# Patient Record
Sex: Male | Born: 1980 | Race: Black or African American | Hispanic: No | Marital: Married | Smoking: Never smoker
Health system: Southern US, Community
[De-identification: ages and names within clinical notes are randomized; demographics above are authoritative.]

---

## 2007-06-26 ENCOUNTER — Ambulatory Visit: Payer: Self-pay | Admitting: Family Medicine

## 2007-07-03 ENCOUNTER — Encounter: Payer: Self-pay | Admitting: Family Medicine

## 2007-07-03 LAB — CONVERTED CEMR LAB
ALT: 13 units/L (ref 0–53)
AST: 12 units/L (ref 0–37)
Albumin: 4.8 g/dL (ref 3.5–5.2)
Alkaline Phosphatase: 62 units/L (ref 39–117)
Calcium: 9.4 mg/dL (ref 8.4–10.5)
Chloride: 106 meq/L (ref 96–112)
LDL Cholesterol: 108 mg/dL — ABNORMAL HIGH (ref 0–99)
Potassium: 4.2 meq/L (ref 3.5–5.3)
Sodium: 141 meq/L (ref 135–145)
Total Protein: 7.5 g/dL (ref 6.0–8.3)

## 2007-07-04 ENCOUNTER — Encounter: Payer: Self-pay | Admitting: Family Medicine

## 2007-07-04 ENCOUNTER — Telehealth: Payer: Self-pay | Admitting: Family Medicine

## 2007-07-10 ENCOUNTER — Encounter: Admission: RE | Admit: 2007-07-10 | Discharge: 2007-08-07 | Payer: Self-pay | Admitting: Family Medicine

## 2007-08-29 ENCOUNTER — Encounter: Payer: Self-pay | Admitting: Family Medicine

## 2008-06-02 ENCOUNTER — Ambulatory Visit: Payer: Self-pay | Admitting: Occupational Medicine

## 2010-06-14 ENCOUNTER — Ambulatory Visit
Admission: RE | Admit: 2010-06-14 | Discharge: 2010-06-14 | Disposition: A | Discharge status: Home / Self Care | Payer: 59 | Source: Ambulatory Visit | Attending: Family Medicine | Admitting: Family Medicine

## 2010-06-14 ENCOUNTER — Encounter: Payer: Self-pay | Admitting: Family Medicine

## 2010-06-14 ENCOUNTER — Ambulatory Visit (INDEPENDENT_AMBULATORY_CARE_PROVIDER_SITE_OTHER): Payer: 59 | Admitting: Family Medicine

## 2010-06-14 ENCOUNTER — Other Ambulatory Visit: Payer: Self-pay | Admitting: Family Medicine

## 2010-06-14 DIAGNOSIS — M79609 Pain in unspecified limb: Secondary | ICD-10-CM

## 2010-06-14 DIAGNOSIS — M79673 Pain in unspecified foot: Secondary | ICD-10-CM

## 2010-06-18 ENCOUNTER — Telehealth (INDEPENDENT_AMBULATORY_CARE_PROVIDER_SITE_OTHER): Payer: Self-pay | Admitting: *Deleted

## 2010-06-23 NOTE — Progress Notes (Signed)
  Phone Note Outgoing Call Call back at [facility] Phone [phone]   Call placed by: Emilio Math,  June 18, 2010 6:41 PM Call placed to: Patient Summary of Call: Left msg hope his foot is better, call with problems or concerns

## 2010-06-23 NOTE — Assessment & Plan Note (Signed)
Summary: RIGHT FOOT PAIN (rm 4)   Vital Signs:  Patient Profile:   30 Years Old Male CC:      right foot/toe pain x 2 weeks Height:     71.5 inches Weight:      171 pounds O2 Sat:      99 % O2 treatment:    Room Air Temp:     98.5 degrees F oral Pulse rate:   62 / minute Resp:     14 per minute BP sitting:   110 / 73  (left arm) Cuff size:   regular  Pt. in pain?   yes    Location:   right foot    Intensity:   5    Type:       sharp  Vitals Entered By: Lajean Saver RN (June 14, 2010 4:15 PM)                   Updated Prior Medication List: No Medications Current Allergies: No known allergies History of Present Illness Chief Complaint: right foot/toe pain x 2 weeks History of Present Illness:  Subjective:  Patient complains of 2.5 week history of pain in the right second toe when bearing weight.  He recalls no trauma.  No recent change in activities.  No recent change in shoe wear.  REVIEW OF SYSTEMS Constitutional Symptoms      Denies fever, chills, night sweats, weight loss, weight gain, and fatigue.  Eyes       Denies change in vision, eye pain, eye discharge, glasses, contact lenses, and eye surgery. Ear/Nose/Throat/Mouth       Denies hearing loss/aids, change in hearing, ear pain, ear discharge, dizziness, frequent runny nose, frequent nose bleeds, sinus problems, sore throat, hoarseness, and tooth pain or bleeding.  Respiratory       Denies dry cough, productive cough, wheezing, shortness of breath, asthma, bronchitis, and emphysema/COPD.  Cardiovascular       Denies murmurs, chest pain, and tires easily with exhertion.    Gastrointestinal       Denies stomach pain, nausea/vomiting, diarrhea, constipation, blood in bowel movements, and indigestion. Genitourniary       Denies painful urination, kidney stones, and loss of urinary control. Neurological       Denies paralysis, seizures, and fainting/blackouts. Musculoskeletal       Complains of muscle  pain and joint pain.      Denies joint stiffness, decreased range of motion, redness, swelling, muscle weakness, and gout.      Comments: right foot Skin       Denies bruising, unusual mles/lumps or sores, and hair/skin or nail changes.  Psych       Denies mood changes, temper/anger issues, anxiety/stress, speech problems, depression, and sleep problems. Other Comments: no injury   Past History:  Past Medical History: Reviewed history from 06/26/2007 and no changes required. None  Past Surgical History: Reviewed history from 06/26/2007 and no changes required. None  Family History: Reviewed history from 06/26/2007 and no changes required. Unle with prostate Ca GF with DM, HTN, stroke  Social History: Reviewed history from 06/26/2007 and no changes required. 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 Married to [place] with no children.  Never Smoked Alcohol use-no Drug use-no Regular exercise-no   Objective:  Appearance:  Patient appears healthy, stated age, and in no acute distress  Right foot:  No swelling, warmth, or erythema.  There is mild tenderness over the second distal metatarsal and MTP joint.  All toes  have full range of motion.  Distal neurovascular intact. X-ray right foot:  negative. Assessment New Problems: FOOT PAIN, RIGHT (ICD-729.5)  ? METATARSALGIA VS ? [facility]  Plan New Orders: T-DG Foot Complete*R* [73630] Est. Patient Level III [56213] Planning Comments:   Obtain good quality shoe arch supports.  Begin NSAID for about a week.  Begin foot stretching exercises ([facility] information and instruction patient handout given)  If not improving 2 weeks, follow-up with sports med clinic.   The patient and/or caregiver has been counseled thoroughly with regard to medications prescribed including dosage, schedule, interactions, rationale for use, and possible side effects and they verbalize understanding.  Diagnoses and expected course of recovery  discussed and will return if not improved as expected or if the condition worsens. Patient and/or caregiver verbalized understanding.   Orders Added: 1)  T-DG Foot Complete*R* [73630] 2)  Est. Patient Level III [08657]

## 2011-01-31 ENCOUNTER — Encounter: Payer: Self-pay | Admitting: *Deleted

## 2011-01-31 ENCOUNTER — Emergency Department (HOSPITAL_BASED_OUTPATIENT_CLINIC_OR_DEPARTMENT_OTHER)
Admission: EM | Admit: 2011-01-31 | Discharge: 2011-01-31 | Disposition: A | Discharge status: Home / Self Care | Payer: 59 | Attending: Emergency Medicine | Admitting: Emergency Medicine

## 2011-01-31 DIAGNOSIS — W1809XA Striking against other object with subsequent fall, initial encounter: Secondary | ICD-10-CM | POA: Insufficient documentation

## 2011-01-31 DIAGNOSIS — S0990XA Unspecified injury of head, initial encounter: Secondary | ICD-10-CM

## 2011-01-31 MED ORDER — IBUPROFEN 400 MG PO TABS
400.0000 mg | ORAL_TABLET | Freq: Once | ORAL | Status: AC
  Administered 2011-01-31: 400 mg via ORAL
  Filled 2011-01-31: qty 1

## 2011-01-31 NOTE — ED Notes (Signed)
Patient reports that he was closing the doors on a tractor trailer and the door on the building came down and hit him in the forehead.  Denies loc.  States he was knocked to the floor.  Swelling to forehead.

## 2011-01-31 NOTE — ED Notes (Signed)
EMS reports patient was at work and was hit in the forehead with heavy bays doors while working. States patient lowered himself to the floor.  No loc.  Swelling in the forehead.

## 2011-01-31 NOTE — ED Provider Notes (Signed)
History   30yM with head injury. Struck top of head on a door while unloading truck. Fell to ground. No LOC. Persistent HA and R lateral neck pain. No visual complaints. No numbness, tingling or loss of strength. No n/v. No back pain. Doesn't feel confused. Denies use of blood thinning medications.  CSN: [id number] Arrival date & time: No admission date for patient encounter.  Chief Complaint  Patient presents with  . Head Injury    (Consider location/radiation/quality/duration/timing/severity/associated sxs/prior treatment) Patient is a 30 y.o. male presenting with head injury. The history is provided by the patient.  Head Injury  Pertinent negatives include no numbness, no tinnitus and no weakness.    History reviewed. No pertinent past medical history.  No past surgical history on file.  No family history on file.  History  Substance Use Topics  . Smoking status: Not on file  . Smokeless tobacco: Not on file  . Alcohol Use: Not on file      Review of Systems  Constitutional: Negative.   HENT: Positive for neck pain. Negative for hearing loss and tinnitus.   Eyes: Negative for photophobia and visual disturbance.  Gastrointestinal: Negative.   Genitourinary: Negative.   Musculoskeletal: Negative for back pain.  Skin: Negative.   Neurological: Positive for headaches. Negative for dizziness, seizures, speech difficulty, weakness, light-headedness and numbness.  Psychiatric/Behavioral: Negative.   All other systems reviewed and are negative.    Allergies  Review of patient's allergies indicates no known allergies.  Home Medications  No current outpatient prescriptions on file.  BP 122/73  Pulse 57  Temp(Src) 97.9 F (36.6 C) (Oral)  Resp 18  SpO2 100%  Physical Exam  Constitutional: He is oriented to person, place, and time. He appears well-developed and well-nourished.  HENT:  Head: Normocephalic and atraumatic.  Right Ear: External ear normal.  Left  Ear: External ear normal.  Eyes: Conjunctivae and EOM are normal. Pupils are equal, round, and reactive to light.  Neck: Normal range of motion.       Mild r lateral neck tenderness. FROM. No crepitus or skin changes. No midline spinal tenderness.  Pulmonary/Chest: Effort normal and breath sounds normal. No respiratory distress.  Abdominal: There is no tenderness.  Musculoskeletal: Normal range of motion.  Neurological: He is alert and oriented to person, place, and time. No cranial nerve deficit. He exhibits normal muscle tone. Coordination normal.       Good finger-to-nose bilaterally.  Skin: Skin is warm and dry.  Psychiatric: He has a normal mood and affect.    ED Course  Procedures (including critical care time)  Labs Reviewed - No data to display No results found.   No diagnosis found.    MDM  30yM with HA after being struck in hea.. No LOC. Nonfocal neuro exam. No midline spinal tenderness. Discussed head injury instructions with pt. Do not feel neuro imaging indicated at this time.       Raeford Razor, MD 01/31/11 [phone]

## 2017-01-26 ENCOUNTER — Encounter: Payer: Self-pay | Admitting: Emergency Medicine

## 2017-01-26 ENCOUNTER — Emergency Department (INDEPENDENT_AMBULATORY_CARE_PROVIDER_SITE_OTHER)
Admission: EM | Admit: 2017-01-26 | Discharge: 2017-01-26 | Disposition: A | Discharge status: Home / Self Care | Payer: 59 | Source: Home / Self Care | Attending: Family Medicine | Admitting: Family Medicine

## 2017-01-26 DIAGNOSIS — J069 Acute upper respiratory infection, unspecified: Secondary | ICD-10-CM | POA: Diagnosis not present

## 2017-01-26 MED ORDER — BENZONATATE 100 MG PO CAPS: 100.0000 mg | ORAL_CAPSULE | Freq: Three times a day (TID) | ORAL | 0 refills | Status: AC

## 2017-01-26 MED ORDER — AZITHROMYCIN 250 MG PO TABS: 250.0000 mg | ORAL_TABLET | Freq: Every day | ORAL | 0 refills | Status: AC

## 2017-01-26 NOTE — ED Triage Notes (Signed)
Productive cough x 5 days

## 2017-01-26 NOTE — Discharge Instructions (Signed)
°  You may take 500mg acetaminophen every 4-6 hours or in combination with ibuprofen 400-600mg every 6-8 hours as needed for pain, inflammation, and fever. ° °Be sure to drink at least eight 8oz glasses of water to stay well hydrated and get at least 8 hours of sleep at night, preferably more while sick.  ° °Please take antibiotics as prescribed and be sure to complete entire course even if you start to feel better to ensure infection does not come back. ° °

## 2017-01-26 NOTE — ED Provider Notes (Signed)
[facility]    CSN: [id number] Arrival date & time: 01/26/17  1947     History   Chief Complaint Chief Complaint  Patient presents with  . Cough    HPI David Wilson is a 36 y.o. male.   HPI David Wilson is a 36 y.o. male presenting to UC with c/o worsening productive cough for the last 5 days, associated body aches and fatigue.  He was seen by his PCP earlier this week and receive a steroid shot and Flonase but states he is not improving. He has been taking Mucinex DM as well with minimal relief. Denies fever, chills, n/v/d. No known sick contacts.   History reviewed. No pertinent past medical history.  There are no active problems to display for this patient.   History reviewed. No pertinent surgical history.     Home Medications    Prior to Admission medications   Medication Sig Start Date End Date Taking? Authorizing Provider  fluticasone (FLONASE) 50 MCG/ACT nasal spray Place into both nostrils daily.   Yes [provider]  azithromycin (ZITHROMAX) 250 MG tablet Take 1 tablet (250 mg total) by mouth daily. Take first 2 tablets together, then 1 every day until finished. 01/26/17   Lurene Shadow, PA-C  benzonatate (TESSALON) 100 MG capsule Take 1-2 capsules (100-200 mg total) by mouth every 8 (eight) hours. 01/26/17   Lurene Shadow, PA-C    Family History No family history on file.  Social History Social History  Substance Use Topics  . Smoking status: Never Smoker  . Smokeless tobacco: Never Used  . Alcohol use No     Allergies   Patient has no known allergies.   Review of Systems Review of Systems  Constitutional: Positive for fatigue. Negative for chills and fever.  HENT: Positive for congestion. Negative for ear pain, sore throat, trouble swallowing and voice change.   Respiratory: Positive for cough and chest tightness. Negative for shortness of breath.   Cardiovascular: Negative for chest pain and palpitations.    Gastrointestinal: Negative for abdominal pain, diarrhea, nausea and vomiting.  Musculoskeletal: Positive for arthralgias and myalgias. Negative for back pain.  Skin: Negative for rash.     Physical Exam Triage Vital Signs ED Triage Vitals  Enc Vitals Group     BP 01/26/17 2002 112/78     Pulse Rate 01/26/17 2002 (!) 56     Resp --      Temp 01/26/17 2002 98.5 F (36.9 C)     Temp Source 01/26/17 2002 Oral     SpO2 01/26/17 2002 98 %     Weight 01/26/17 2002 207 lb (93.9 kg)     Height 01/26/17 2002  (1.803 m)     Head Circumference --      Peak Flow --      Pain Score 01/26/17 2003 0     Pain Loc --      Pain Edu? --      Excl. in GC? --    No data found.   Updated Vital Signs BP 112/78 (BP Location: Left Arm)   Pulse (!) 56   Temp 98.5 F (36.9 C) (Oral)   Ht  (1.803 m)   Wt 207 lb (93.9 kg)   SpO2 98%   BMI 28.87 kg/m   Visual Acuity Right Eye Distance:   Left Eye Distance:   Bilateral Distance:    Right Eye Near:   Left Eye Near:  Bilateral Near:     Physical Exam  Constitutional: He is oriented to person, place, and time. He appears well-developed and well-nourished. No distress.  HENT:  Head: Normocephalic and atraumatic.  Right Ear: Tympanic membrane normal.  Left Ear: Tympanic membrane normal.  Nose: Mucosal edema present. Right sinus exhibits no maxillary sinus tenderness and no frontal sinus tenderness. Left sinus exhibits no maxillary sinus tenderness and no frontal sinus tenderness.  Mouth/Throat: Uvula is midline, oropharynx is clear and moist and mucous membranes are normal.  Eyes: EOM are normal.  Neck: Normal range of motion. Neck supple.  Cardiovascular: Normal rate and regular rhythm.   Pulmonary/Chest: Effort normal. No stridor. No respiratory distress. He has no wheezes. He has rhonchi (faint, diffuse). He has no rales.  Musculoskeletal: Normal range of motion.  Lymphadenopathy:    He has no cervical adenopathy.   Neurological: He is alert and oriented to person, place, and time.  Skin: Skin is warm and dry. He is not diaphoretic.  Psychiatric: He has a normal mood and affect. His behavior is normal.  Nursing note and vitals reviewed.    UC Treatments / Results  Labs (all labs ordered are listed, but only abnormal results are displayed) Labs Reviewed - No data to display  EKG  EKG Interpretation None       Radiology No results found.  Procedures Procedures (including critical care time)  Medications Ordered in UC Medications - No data to display   Initial Impression / Assessment and Plan / UC Course  I have reviewed the triage vital signs and the nursing notes.  Pertinent labs & imaging results that were available during my care of the patient were reviewed by me and considered in my medical decision making (see chart for details).     Will cover for atypical bacteria with azithromycin.  Advised pt could may linger for several weeks but should gradually improve. Recommend taking plain mucinex instead of mucinex DM Encouraged fluids and rest F/u with PCP in 7-10 days if not improving.  Final Clinical Impressions(s) / UC Diagnoses   Final diagnoses:  Upper respiratory tract infection, unspecified type    New Prescriptions Discharge Medication List as of 01/26/2017  8:06 PM    START taking these medications   Details  azithromycin (ZITHROMAX) 250 MG tablet Take 1 tablet (250 mg total) by mouth daily. Take first 2 tablets together, then 1 every day until finished., Starting Thu 01/26/2017, Print    benzonatate (TESSALON) 100 MG capsule Take 1-2 capsules (100-200 mg total) by mouth every 8 (eight) hours., Starting Thu 01/26/2017, Print         Controlled Substance Prescriptions [place] Controlled Substance Registry consulted? Not Applicable   Lurene Shadow, PA-C 01/27/17 1000

## 2017-05-03 ENCOUNTER — Emergency Department (INDEPENDENT_AMBULATORY_CARE_PROVIDER_SITE_OTHER): Payer: 59

## 2017-05-03 ENCOUNTER — Emergency Department (INDEPENDENT_AMBULATORY_CARE_PROVIDER_SITE_OTHER)
Admission: EM | Admit: 2017-05-03 | Discharge: 2017-05-03 | Disposition: A | Discharge status: Home / Self Care | Payer: 59 | Source: Home / Self Care | Attending: Family Medicine | Admitting: Family Medicine

## 2017-05-03 ENCOUNTER — Encounter: Payer: Self-pay | Admitting: Emergency Medicine

## 2017-05-03 DIAGNOSIS — R091 Pleurisy: Secondary | ICD-10-CM | POA: Diagnosis not present

## 2017-05-03 DIAGNOSIS — R0789 Other chest pain: Secondary | ICD-10-CM | POA: Diagnosis not present

## 2017-05-03 DIAGNOSIS — R05 Cough: Secondary | ICD-10-CM

## 2017-05-03 NOTE — ED Provider Notes (Signed)
[facility]    CSN: [id number] Arrival date & time: 05/03/17  1254     History   Chief Complaint Chief Complaint  Patient presents with  . Pleurisy    HPI David Wilson is a 37 y.o. male.   HPI David Wilson is a 37 y.o. male presenting to UC with c/o chest tightness and discomfort that started last night after using his CPAP machine. When he took his CPAP machine off he didn't see anything at first but then used a Q-tip and saw "black stuff" he is concerned was mold.  He slept the rest of the night w/o his CPAP but still had chest soreness this morning.  Denies cough, congestion, wheeze, or SOB. No hx of COPD or asthma. No recent URI symptoms. He has not tried anything for pain.  Denies hx of HTN, high cholesterol or CAD.    History reviewed. No pertinent past medical history.  There are no active problems to display for this patient.   History reviewed. No pertinent surgical history.     Home Medications    Prior to Admission medications   Medication Sig Start Date End Date Taking? Authorizing Provider  azithromycin (ZITHROMAX) 250 MG tablet Take 1 tablet (250 mg total) by mouth daily. Take first 2 tablets together, then 1 every day until finished. 01/26/17   Lurene ShadowPhelps, Daenerys Buttram O, PA-C  benzonatate (TESSALON) 100 MG capsule Take 1-2 capsules (100-200 mg total) by mouth every 8 (eight) hours. 01/26/17   Lurene ShadowPhelps, Kylan Veach O, PA-C  fluticasone (FLONASE) 50 MCG/ACT nasal spray Place into both nostrils daily.    [provider]    Family History History reviewed. No pertinent family history.  Social History Social History   Tobacco Use  . Smoking status: Never Smoker  . Smokeless tobacco: Never Used  Substance Use Topics  . Alcohol use: No  . Drug use: No     Allergies   Patient has no known allergies.   Review of Systems Review of Systems  Constitutional: Negative for chills and fever.  HENT: Negative for congestion, ear pain, sore throat,  trouble swallowing and voice change.   Respiratory: Positive for chest tightness. Negative for cough and shortness of breath.   Cardiovascular: Positive for chest pain (soreness). Negative for palpitations.  Gastrointestinal: Negative for abdominal pain, diarrhea, nausea and vomiting.  Musculoskeletal: Negative for arthralgias, back pain and myalgias.  Skin: Negative for rash.     Physical Exam Triage Vital Signs ED Triage Vitals  Enc Vitals Group     BP --      Pulse --      Resp --      Temp 05/03/17 1327 98.8 F (37.1 C)     Temp Source 05/03/17 1327 Oral     SpO2 05/03/17 1327 98 %     Weight 05/03/17 1334 207 lb (93.9 kg)     Height --      Head Circumference --      Peak Flow --      Pain Score 05/03/17 1335 0     Pain Loc --      Pain Edu? --      Excl. in GC? --    No data found.  Updated Vital Signs BP 108/71 (BP Location: Right Arm)   Pulse 68   Temp 98.8 F (37.1 C) (Oral)   Wt 207 lb (93.9 kg)   SpO2 98%   BMI 28.87 kg/m   Visual Acuity Right  Eye Distance:   Left Eye Distance:   Bilateral Distance:    Right Eye Near:   Left Eye Near:    Bilateral Near:     Physical Exam  Constitutional: He is oriented to person, place, and time. He appears well-developed and well-nourished. No distress.  HENT:  Head: Normocephalic and atraumatic.  Mouth/Throat: Oropharynx is clear and moist.  Eyes: EOM are normal.  Neck: Normal range of motion. Neck supple.  Cardiovascular: Normal rate and regular rhythm.  Pulmonary/Chest: Effort normal and breath sounds normal. No stridor. No respiratory distress. He has no wheezes. He has no rales. He exhibits no tenderness.  Musculoskeletal: Normal range of motion.  Neurological: He is alert and oriented to person, place, and time.  Skin: Skin is warm and dry. He is not diaphoretic.  Psychiatric: He has a normal mood and affect. His behavior is normal.  Nursing note and vitals reviewed.    UC Treatments / Results    Labs (all labs ordered are listed, but only abnormal results are displayed) Labs Reviewed - No data to display  EKG  EKG Interpretation None       Radiology Dg Chest 2 View  Result Date: 05/03/2017 CLINICAL DATA:  Chest tightness and cough EXAM: CHEST  2 VIEW COMPARISON:  None. FINDINGS: The heart size and mediastinal contours are within normal limits. Both lungs are clear. The visualized skeletal structures are unremarkable. IMPRESSION: No active cardiopulmonary disease. Electronically Signed   By: Marlan Palau M.D.   On: 05/03/2017 14:02    Procedures Procedures (including critical care time)  Medications Ordered in UC Medications - No data to display   Initial Impression / Assessment and Plan / UC Course  I have reviewed the triage vital signs and the nursing notes.  Pertinent labs & imaging results that were available during my care of the patient were reviewed by me and considered in my medical decision making (see chart for details).     Doubt PE or ACS.  Vitals: WNL CXR: Normal Reassured pt.  Chest tightness could be due to not using his CPAP like normal last night.  May try acetaminophen and ibuprofen Pt has since cleaned his machine.  Encouraged to use today as normal. F/u with PCP if still having discomfort. Discussed symptoms that warrant emergent care in the ED.   Patient verbalized understanding and agreement with treatment plan.   Final Clinical Impressions(s) / UC Diagnoses   Final diagnoses:  Pleurisy    ED Discharge Orders    None       Controlled Substance Prescriptions [place] Controlled Substance Registry consulted? Not Applicable   Rolla Plate 05/03/17 1521

## 2017-05-03 NOTE — Discharge Instructions (Signed)
  You may take 500mg acetaminophen every 4-6 hours or in combination with ibuprofen 400-600mg every 6-8 hours as needed for pain and inflammation.  

## 2017-05-03 NOTE — ED Notes (Signed)
Pt BP and pulse added. 108/71 68 pulse

## 2017-05-03 NOTE — ED Triage Notes (Signed)
Pt c/o chest tightness and discomfort that started after using his CPAP machine last night. Denies wheezing or SOB.

## 2017-05-05 ENCOUNTER — Telehealth: Payer: Self-pay

## 2017-05-05 NOTE — Telephone Encounter (Signed)
Feeling much better.  Will follow up as needed.

## 2018-08-02 IMAGING — DX DG CHEST 2V
2 series · 2 of 2 positions shown · non-contrast
Comparison: None.

CLINICAL DATA: Chest tightness and cough

EXAM:
CHEST  2 VIEW

[chest pa]
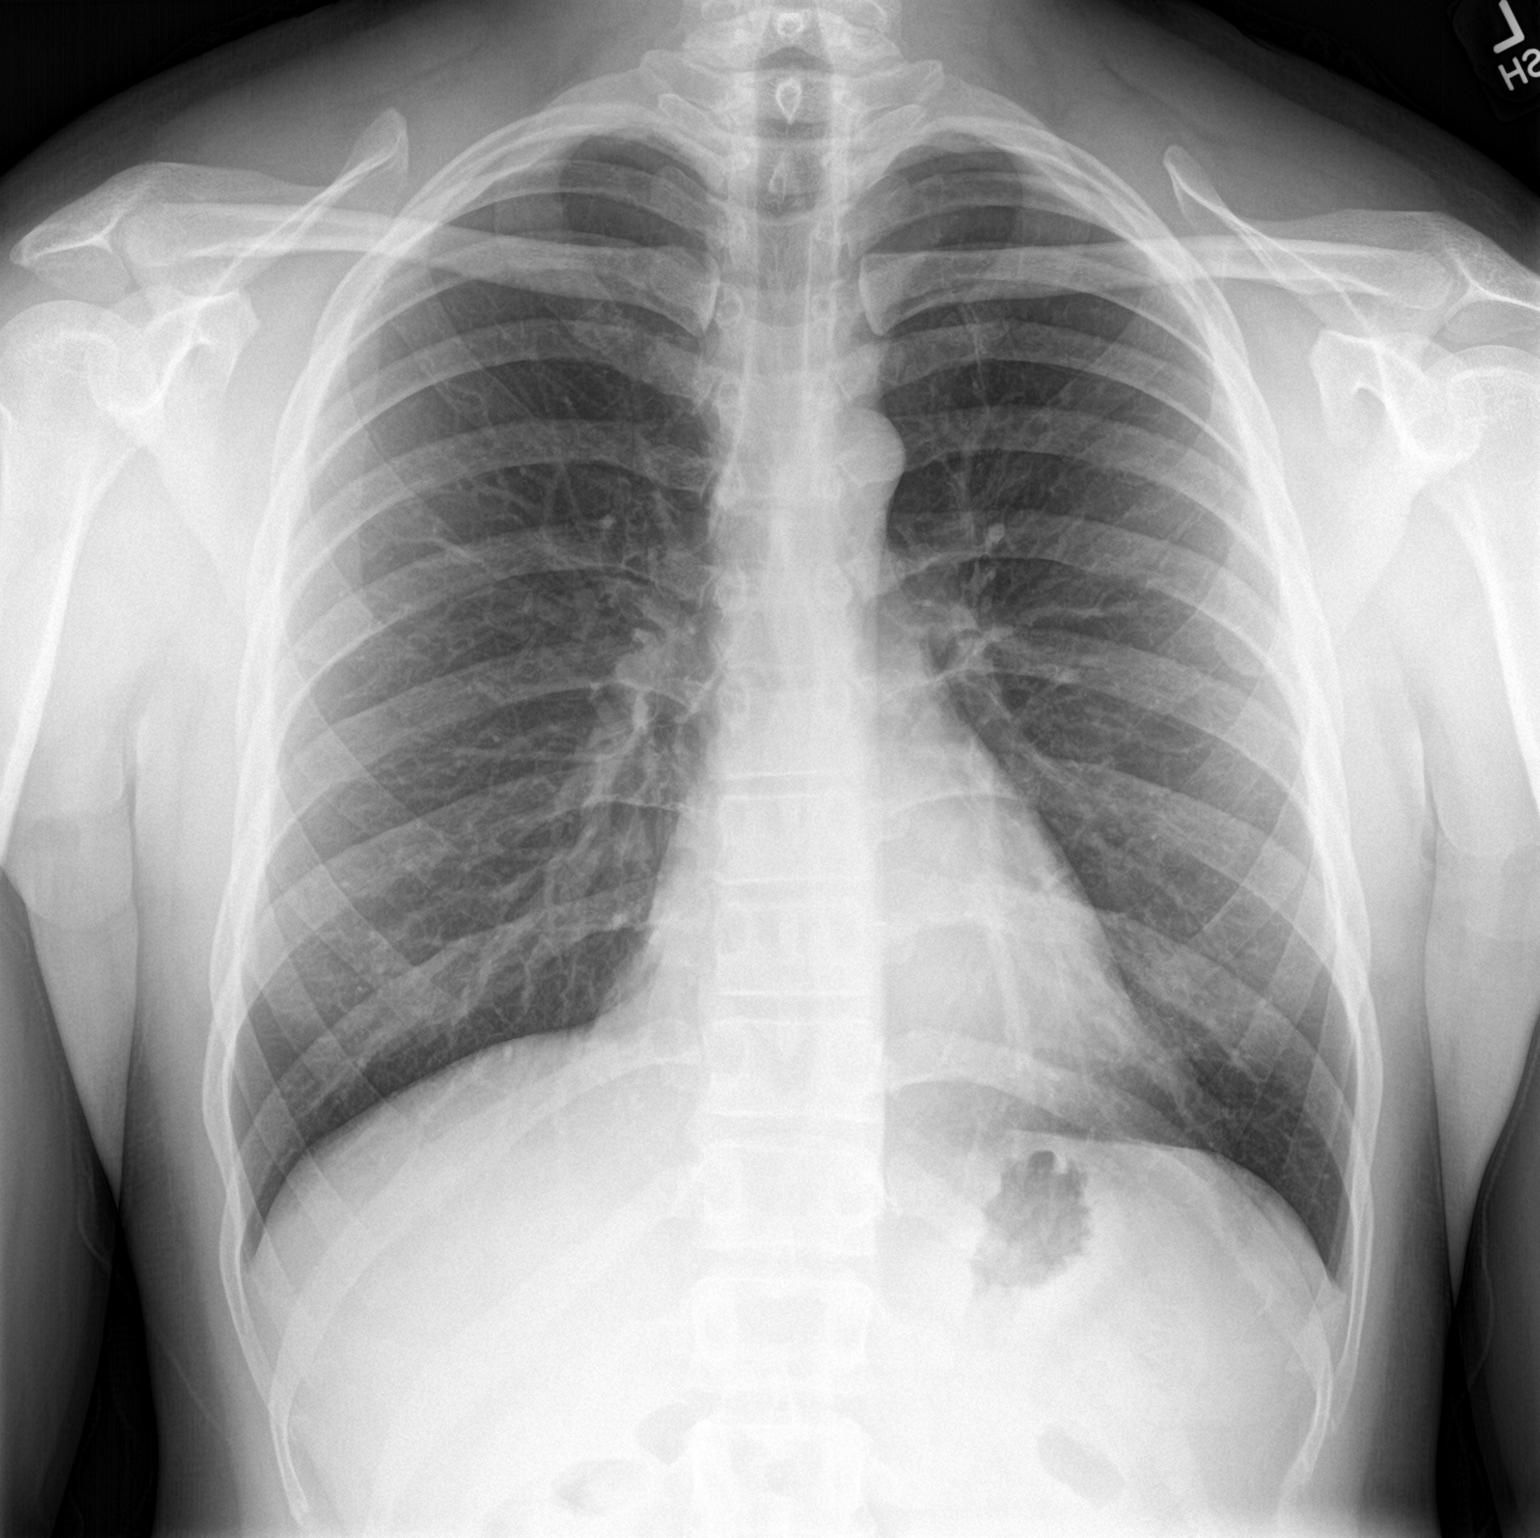

[chest lat]
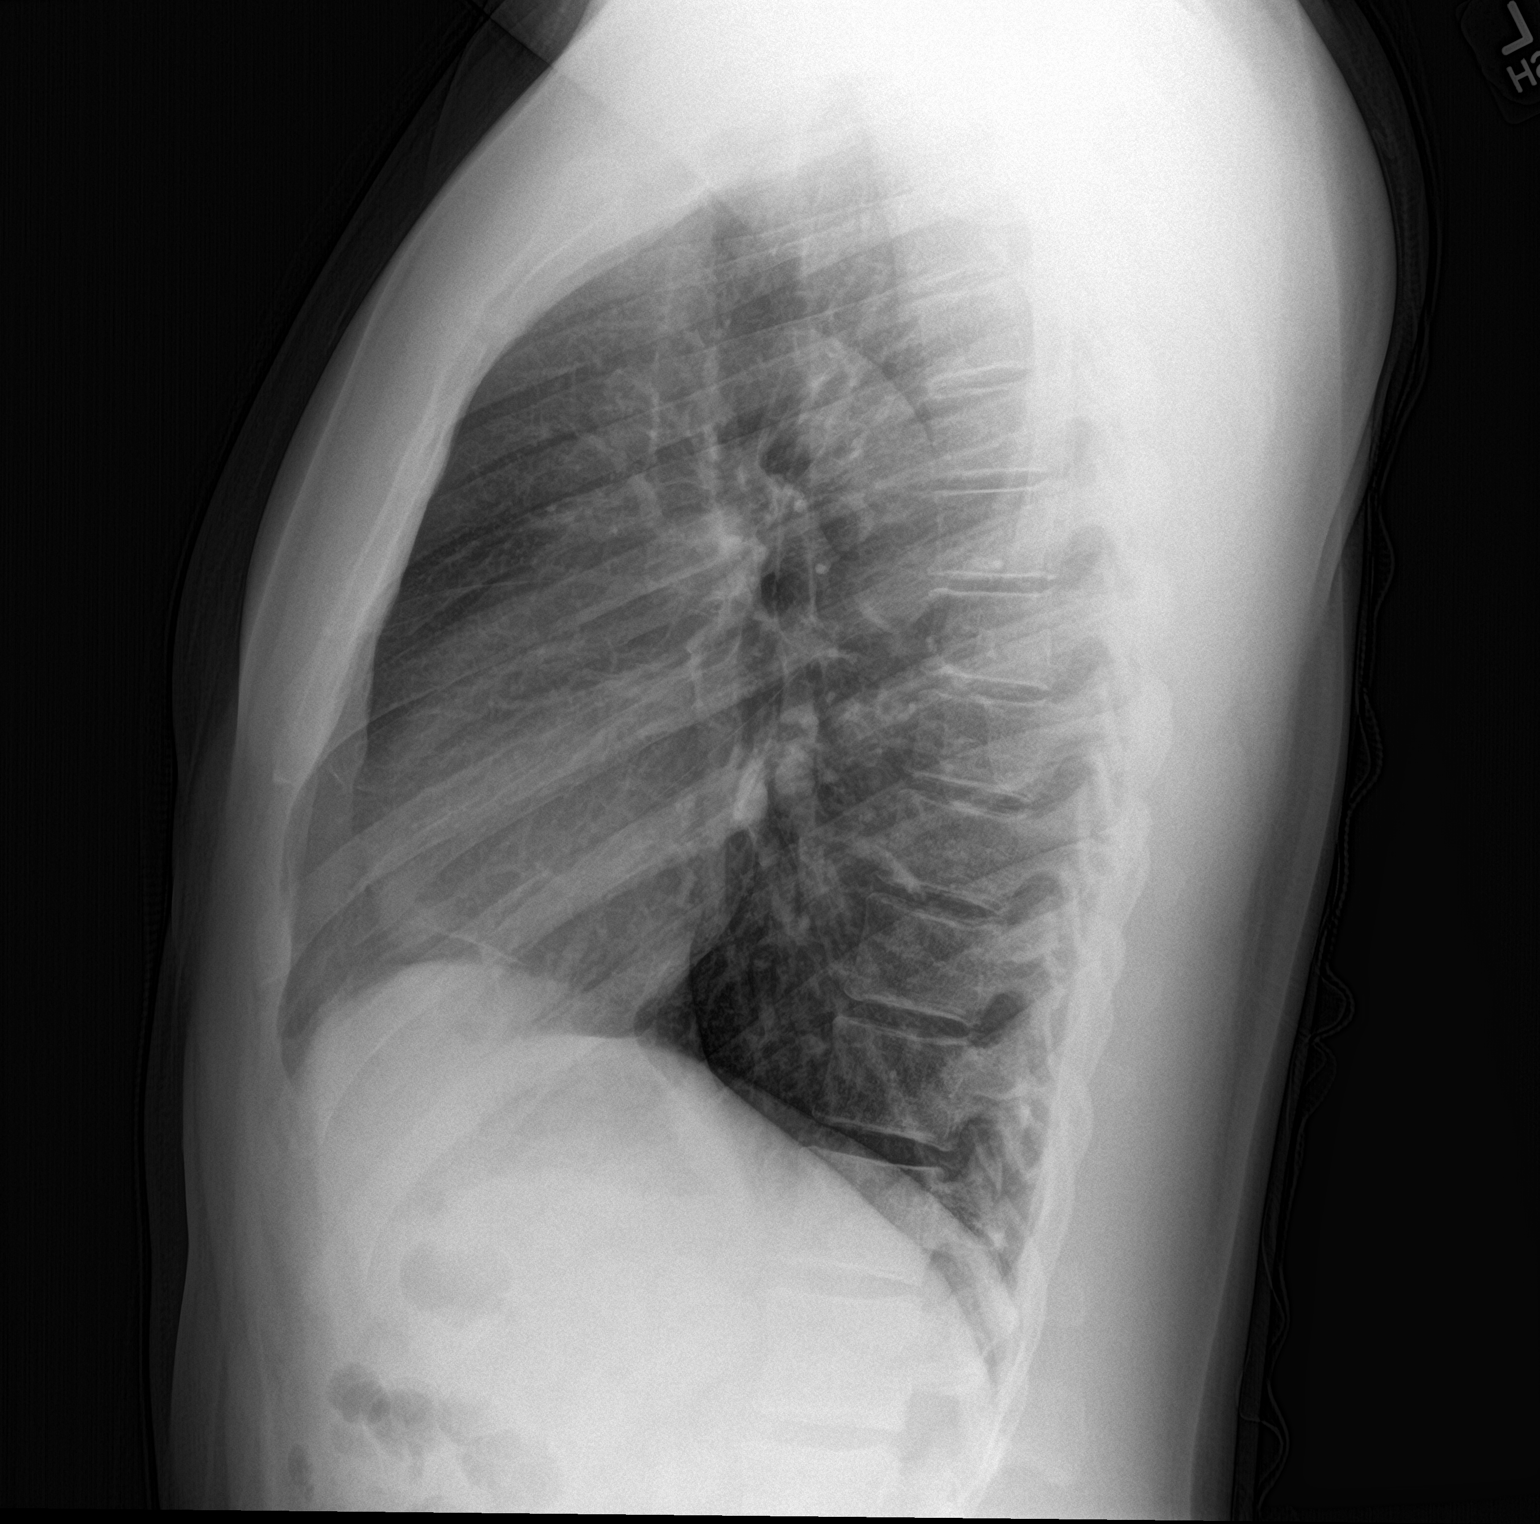

[2 of 2 positions shown; findings below may reference images not displayed]

FINDINGS: The heart size and mediastinal contours are within normal limits.
Both lungs are clear. The visualized skeletal structures are
unremarkable.
IMPRESSION: No active cardiopulmonary disease.
